# Patient Record
Sex: Female | Born: 1990 | Race: Black or African American | Hispanic: No | Marital: Single | Smoking: Current every day smoker
Health system: Southern US, Community
[De-identification: ages and names within clinical notes are randomized; demographics above are authoritative.]

## PROBLEM LIST (undated history)

## (undated) ENCOUNTER — Inpatient Hospital Stay (HOSPITAL_COMMUNITY): Payer: Self-pay

## (undated) HISTORY — PX: TONSILLECTOMY AND ADENOIDECTOMY: SUR1326

---

## 2012-03-10 ENCOUNTER — Encounter (HOSPITAL_COMMUNITY): Payer: Self-pay | Admitting: *Deleted

## 2012-03-10 ENCOUNTER — Emergency Department (HOSPITAL_COMMUNITY)
Admission: EM | Admit: 2012-03-10 | Discharge: 2012-03-10 | Disposition: A | Discharge status: Home / Self Care | Payer: Self-pay | Attending: Emergency Medicine | Admitting: Emergency Medicine

## 2012-03-10 DIAGNOSIS — E119 Type 2 diabetes mellitus without complications: Secondary | ICD-10-CM | POA: Insufficient documentation

## 2012-03-10 DIAGNOSIS — S53499A Other sprain of unspecified elbow, initial encounter: Secondary | ICD-10-CM | POA: Insufficient documentation

## 2012-03-10 DIAGNOSIS — S56919A Strain of unspecified muscles, fascia and tendons at forearm level, unspecified arm, initial encounter: Secondary | ICD-10-CM

## 2012-03-10 DIAGNOSIS — F17200 Nicotine dependence, unspecified, uncomplicated: Secondary | ICD-10-CM | POA: Insufficient documentation

## 2012-03-10 DIAGNOSIS — X58XXXA Exposure to other specified factors, initial encounter: Secondary | ICD-10-CM | POA: Insufficient documentation

## 2012-03-10 LAB — GLUCOSE, CAPILLARY: Glucose-Capillary: 95 mg/dL (ref 70–99)

## 2012-03-10 NOTE — Progress Notes (Signed)
Orthopedic Tech Progress Note Patient Details:  Brandy Wagner 1990/10/27 [id number]  Ortho Devices Type of Ortho Device: Arm foam sling Ortho Device/Splint Location: (R) UE Ortho Device/Splint Interventions: Application   Jennye Moccasin 03/10/2012, 9:33 PM

## 2012-03-10 NOTE — ED Provider Notes (Signed)
History     CSN: [id number]  Arrival date & time 03/10/12  1845   First MD Initiated Contact with Patient 03/10/12 2001      Chief Complaint  Patient presents with  . Arm Pain    (Consider location/radiation/quality/duration/timing/severity/associated sxs/prior treatment) HPI History provided by pt.   Pt presents w/ non-traumatic right forearm pain w/ associated edema x 1.5 weeks.  Developed associated paresthesias today which prompted her to come to ED.   Pain aggravated by palpation and ROM of elbow, wrist and fingers.  Denies fever and skin changes.  Has never had these sx in the past.    Past Medical History  Diagnosis Date  . Diabetes mellitus     History reviewed. No pertinent past surgical history.  No family history on file.  History  Substance Use Topics  . Smoking status: Current Every Day Smoker  . Smokeless tobacco: Not on file  . Alcohol Use: No    OB History    Grav Para Term Preterm Abortions TAB SAB Ect Mult Living                  Review of Systems  All other systems reviewed and are negative.    Allergies  Review of patient's allergies indicates no known allergies.  Home Medications   Current Outpatient Rx  Name Route Sig Dispense Refill  . METFORMIN HCL 500 MG PO TABS Oral Take 500 mg by mouth daily with breakfast.      BP 135/81  Pulse 89  Temp 98.6 F (37 C) (Oral)  Resp 16  SpO2 100%  LMP 02/09/2012  Physical Exam  Nursing note and vitals reviewed. Constitutional: She is oriented to person, place, and time. She appears well-developed and well-nourished. No distress.  HENT:  Head: Normocephalic and atraumatic.  Eyes:       Normal appearance  Neck: Normal range of motion.  Pulmonary/Chest: Effort normal.  Musculoskeletal:       Right distal forearm with possible mild edema when compared to the left.  No edema of hand and no skin changes.  Diffuse tenderness of entire flexor surface of forearm.  Pain and guarding w/ passive  ROM of all fingers, wrist and elbow.  2+ radial pulse and distal sensation intact.   Neurological: She is alert and oriented to person, place, and time.  Psychiatric: She has a normal mood and affect. Her behavior is normal.    ED Course  Procedures (including critical care time)  Labs Reviewed - No data to display No results found.   1. Muscle strain of forearm       MDM  Healthy 21yo F presents w/ non-traumatic right arm pain.  Exam most consistent w/ muscle strain or tendonitis.  Doubt DVT because low risk and no significant edema, and no signs of infection.  Ortho tech placed in shoulder sling for comfort.  Recommended rest, ice, elevation and NSAID.  Referred to [facility].  Return precautions discussed.         Otilio Miu, [place] 03/10/12 2119

## 2012-03-10 NOTE — ED Notes (Signed)
Ortho Tech paged.

## 2012-03-10 NOTE — ED Notes (Signed)
Painful rt arm from the elbow down to the rt hand .  No known injury.  Her hand is sl swollen.  Good radial pulse

## 2012-03-11 NOTE — ED Provider Notes (Signed)
Medical screening examination/treatment/procedure(s) were performed by non-physician practitioner and as supervising physician I was immediately available for consultation/collaboration.   Carleene Cooper III, MD 03/11/12 1315

## 2012-11-06 ENCOUNTER — Encounter (HOSPITAL_COMMUNITY): Payer: Self-pay | Admitting: Emergency Medicine

## 2012-11-06 ENCOUNTER — Emergency Department (HOSPITAL_COMMUNITY)
Admission: EM | Admit: 2012-11-06 | Discharge: 2012-11-06 | Disposition: A | Discharge status: Home / Self Care | Payer: Self-pay | Attending: Emergency Medicine | Admitting: Emergency Medicine

## 2012-11-06 ENCOUNTER — Emergency Department (HOSPITAL_COMMUNITY): Payer: Self-pay

## 2012-11-06 DIAGNOSIS — N949 Unspecified condition associated with female genital organs and menstrual cycle: Secondary | ICD-10-CM | POA: Insufficient documentation

## 2012-11-06 DIAGNOSIS — Z3202 Encounter for pregnancy test, result negative: Secondary | ICD-10-CM | POA: Insufficient documentation

## 2012-11-06 DIAGNOSIS — E119 Type 2 diabetes mellitus without complications: Secondary | ICD-10-CM | POA: Insufficient documentation

## 2012-11-06 DIAGNOSIS — N83201 Unspecified ovarian cyst, right side: Secondary | ICD-10-CM

## 2012-11-06 DIAGNOSIS — N83209 Unspecified ovarian cyst, unspecified side: Secondary | ICD-10-CM | POA: Insufficient documentation

## 2012-11-06 DIAGNOSIS — F17200 Nicotine dependence, unspecified, uncomplicated: Secondary | ICD-10-CM | POA: Insufficient documentation

## 2012-11-06 DIAGNOSIS — R102 Pelvic and perineal pain: Secondary | ICD-10-CM

## 2012-11-06 LAB — CBC WITH DIFFERENTIAL/PLATELET
Basophils Absolute: 0.1 10*3/uL (ref 0.0–0.1)
Basophils Relative: 1 % (ref 0–1)
Eosinophils Absolute: 0.1 10*3/uL (ref 0.0–0.7)
Eosinophils Relative: 1 % (ref 0–5)
HCT: 35.2 % — ABNORMAL LOW (ref 36.0–46.0)
Hemoglobin: 12.1 g/dL (ref 12.0–15.0)
Lymphocytes Relative: 22 % (ref 12–46)
Lymphs Abs: 1.9 10*3/uL (ref 0.7–4.0)
MCH: 28.5 pg (ref 26.0–34.0)
MCHC: 34.4 g/dL (ref 30.0–36.0)
MCV: 82.8 fL (ref 78.0–100.0)
Monocytes Absolute: 0.8 10*3/uL (ref 0.1–1.0)
Monocytes Relative: 9 % (ref 3–12)
Neutro Abs: 5.6 10*3/uL (ref 1.7–7.7)
Neutrophils Relative %: 67 % (ref 43–77)
Platelets: 423 10*3/uL — ABNORMAL HIGH (ref 150–400)
RBC: 4.25 MIL/uL (ref 3.87–5.11)
RDW: 13.9 % (ref 11.5–15.5)
WBC: 8.4 10*3/uL (ref 4.0–10.5)

## 2012-11-06 LAB — COMPREHENSIVE METABOLIC PANEL
ALT: 13 U/L (ref 0–35)
AST: 20 U/L (ref 0–37)
Albumin: 3.7 g/dL (ref 3.5–5.2)
Alkaline Phosphatase: 105 U/L (ref 39–117)
BUN: 8 mg/dL (ref 6–23)
CO2: 23 mEq/L (ref 19–32)
Calcium: 9.6 mg/dL (ref 8.4–10.5)
Chloride: 106 mEq/L (ref 96–112)
Creatinine, Ser: 0.69 mg/dL (ref 0.50–1.10)
GFR calc Af Amer: >90 mL/min (ref >90)
GFR calc non Af Amer: >90 mL/min (ref >90)
Glucose, Bld: 95 mg/dL (ref 70–99)
Potassium: 4 mEq/L (ref 3.5–5.1)
Sodium: 139 mEq/L (ref 135–145)
Total Bilirubin: 0.6 mg/dL (ref 0.3–1.2)
Total Protein: 7.8 g/dL (ref 6.0–8.3)

## 2012-11-06 LAB — URINALYSIS, ROUTINE W REFLEX MICROSCOPIC
Bilirubin Urine: NEGATIVE
Glucose, UA: NEGATIVE mg/dL
Hgb urine dipstick: NEGATIVE
Ketones, ur: NEGATIVE mg/dL
Leukocytes, UA: NEGATIVE
Nitrite: NEGATIVE
Protein, ur: NEGATIVE mg/dL
Specific Gravity, Urine: 1.009 (ref 1.005–1.030)
Urobilinogen, UA: 0.2 mg/dL (ref 0.0–1.0)
pH: 8 (ref 5.0–8.0)

## 2012-11-06 LAB — POCT PREGNANCY, URINE: Preg Test, Ur: NEGATIVE

## 2012-11-06 MED ORDER — TRAMADOL HCL 50 MG PO TABS: 50.0000 mg | ORAL_TABLET | Freq: Four times a day (QID) | ORAL | Status: DC | PRN

## 2012-11-06 MED ORDER — HYDROMORPHONE HCL PF 1 MG/ML IJ SOLN
1.0000 mg | Freq: Once | INTRAMUSCULAR | Status: AC
  Administered 2012-11-06: 1 mg via INTRAVENOUS
  Filled 2012-11-06: qty 1

## 2012-11-06 NOTE — ED Notes (Addendum)
Pt states after having intercourse she began having severe pain in center of abdomen. Pt has a history of DM and hypertension but does not take any medications.

## 2012-11-06 NOTE — Progress Notes (Signed)
During [facility] ED 11/06/12 visit CM spoke with pt who confirms self pay [facility] resident with no pcp. CM discussed and provided written information for self pay pcps, importance of pcp for f/u care, www.needymeds.org, discounted pharmacies and other [place] resources such as financial assistance, DSS and  health department  Reviewed resources for [employer] self pay pcps like [facility], family medicine at [address], [facility] family practice, general medical clinics, [facility] urgent care plus others, [facility] out patient pharmacies and housing Pt voiced understanding and appreciation of resources provided

## 2012-11-06 NOTE — ED Notes (Signed)
PA in to re-assess pt.

## 2012-11-06 NOTE — ED Notes (Signed)
Pt returned from US

## 2012-11-06 NOTE — ED Provider Notes (Signed)
History     CSN: [id number]  Arrival date & time 11/06/12  1414   First MD Initiated Contact with Patient 11/06/12 1447      Chief Complaint  Patient presents with  . Abdominal Pain    (Consider location/radiation/quality/duration/timing/severity/associated sxs/prior treatment) HPI Comments: Pt presents to the ED for sudden onset of pelvic pain.  States she was having sexual intercourse earlier today with her female partner when she had a sensation of sharp, shooting pain in her pelvic region, worse on the right side.  Denies any vaginal bleeding afterwards.  Pain worse with movement or walking.  Same sexual partner for the past 5 years, no concern for STDs at this time.  No recent dysuria, hematuria, increased urinary frequency, or vaginal discharge.  Denies possibility of pregnancy.  No hx of ovarian cysts, ovarian cysts, or ectopic pregnancy.  The history is provided by the patient.    Past Medical History  Diagnosis Date  . Diabetes mellitus     History reviewed. No pertinent past surgical history.  History reviewed. No pertinent family history.  History  Substance Use Topics  . Smoking status: Current Every Day Smoker  . Smokeless tobacco: Not on file  . Alcohol Use: Yes     Comment: occasionally     OB History   Grav Para Term Preterm Abortions TAB SAB Ect Mult Living                  Review of Systems  Genitourinary: Positive for pelvic pain.  All other systems reviewed and are negative.    Allergies  Review of patient's allergies indicates no known allergies.  Home Medications  No current outpatient prescriptions on file.  BP 123/69  Pulse 79  Temp(Src) 99.4 F (37.4 C) (Oral)  Resp 20  Ht 5' (1.524 m)  Wt 225 lb (102.059 kg)  BMI 43.94 kg/m2  SpO2 100%  LMP 10/02/2012  Physical Exam  Nursing note and vitals reviewed. Constitutional: She is oriented to person, place, and time. She appears well-developed and well-nourished.  HENT:  Head:  Normocephalic and atraumatic.  Eyes: Conjunctivae and EOM are normal.  Neck: Normal range of motion. Neck supple.  Cardiovascular: Normal rate, regular rhythm and normal heart sounds.   Pulmonary/Chest: Effort normal and breath sounds normal.  Abdominal: Soft. Bowel sounds are normal. There is tenderness. There is no guarding, no CVA tenderness, no tenderness at McBurney's point and negative Clopper's sign.    Pelvic TTP, R > L  Musculoskeletal: Normal range of motion.  Neurological: She is alert and oriented to person, place, and time.  Skin: Skin is warm and dry.  Psychiatric: She has a normal mood and affect.    ED Course  Procedures (including critical care time)  Labs Reviewed  CBC WITH DIFFERENTIAL - Abnormal; Notable for the following:    HCT 35.2 (*)    Platelets 423 (*)    All other components within normal limits  URINALYSIS, ROUTINE W REFLEX MICROSCOPIC  COMPREHENSIVE METABOLIC PANEL  POCT PREGNANCY, URINE   US Transvaginal Non-ob  11/06/2012   *RADIOLOGY REPORT*  Clinical Data: Abdominal pain.  Pelvic pain.  Rule out torsion.  TRANSABDOMINAL AND TRANSVAGINAL ULTRASOUND OF PELVIS Technique:  Both transabdominal and transvaginal ultrasound examinations of the pelvis were performed. Transabdominal technique was performed for global imaging of the pelvis including uterus, ovaries, adnexal regions, and pelvic cul-de-sac.  It was necessary to proceed with endovaginal exam following the transabdominal exam to visualize the uterus,  ovaries, and adnexa  .  Comparison:  None  DOPPLER ULTRASOUND OF OVARIES  Technique:  Color and duplex Doppler ultrasound was utilized to evaluate blood flow to the ovaries.  Findings:  Uterus: 7.9 x 3.9 x 4.5 cm. Normal in morphology.  Endometrium: Normal, 1.4 cm.  Right ovary:  5.1 x 3.8 x 3.9 cm.  4.3 x 2.7 x 3.0 cm structure is favored to represent a single multi septated lesion.  Example image 44. No flow within the lesion.  Normal color and spectral  Doppler tracings to the right ovary.  Left ovary: 5.5 x 4.0 x 4.2 cm.  A 3.7 x 3.6 x 2.7 cm lesion within the demonstrates low level internal echoes and enhanced through transmission. No flow within the lesion.  Minimal irregularity in its superior portion on image 67.  Normal color and spectral Doppler tracings to left ovary.  Other findings: Trace free pelvic fluid is likely physiologic.  IMPRESSION:  1.  No evidence of ovarian or adnexal torsion. 2.  Left ovarian lesion which is favored to represent a hemorrhagic cyst or endometrioma.  Per consensus criteria, follow-up ultrasound is 6 weeks is recommended to confirm stability or resolution. 3.  Similarly, a right ovarian lesion is favored to represent a hemorrhagic cyst.  This could be reevaluated on follow-up as well. This recommendation follows the consensus statement:  Management of Asymptomatic Ovarian and Other Adnexal Cysts Imaged at [place]:  Society of Radiologists in Ultrasound Consensus Conference Statement. Radiology 2010; [phone].   Original Report Authenticated By: Jeronimo Greaves, M.D.   US Pelvis Complete  11/06/2012   *RADIOLOGY REPORT*  Clinical Data: Abdominal pain.  Pelvic pain.  Rule out torsion.  TRANSABDOMINAL AND TRANSVAGINAL ULTRASOUND OF PELVIS Technique:  Both transabdominal and transvaginal ultrasound examinations of the pelvis were performed. Transabdominal technique was performed for global imaging of the pelvis including uterus, ovaries, adnexal regions, and pelvic cul-de-sac.  It was necessary to proceed with endovaginal exam following the transabdominal exam to visualize the uterus, ovaries, and adnexa  .  Comparison:  None  DOPPLER ULTRASOUND OF OVARIES  Technique:  Color and duplex Doppler ultrasound was utilized to evaluate blood flow to the ovaries.  Findings:  Uterus: 7.9 x 3.9 x 4.5 cm. Normal in morphology.  Endometrium: Normal, 1.4 cm.  Right ovary:  5.1 x 3.8 x 3.9 cm.  4.3 x 2.7 x 3.0 cm structure is favored to represent a  single multi septated lesion.  Example image 44. No flow within the lesion.  Normal color and spectral Doppler tracings to the right ovary.  Left ovary: 5.5 x 4.0 x 4.2 cm.  A 3.7 x 3.6 x 2.7 cm lesion within the demonstrates low level internal echoes and enhanced through transmission. No flow within the lesion.  Minimal irregularity in its superior portion on image 67.  Normal color and spectral Doppler tracings to left ovary.  Other findings: Trace free pelvic fluid is likely physiologic.  IMPRESSION:  1.  No evidence of ovarian or adnexal torsion. 2.  Left ovarian lesion which is favored to represent a hemorrhagic cyst or endometrioma.  Per consensus criteria, follow-up ultrasound is 6 weeks is recommended to confirm stability or resolution. 3.  Similarly, a right ovarian lesion is favored to represent a hemorrhagic cyst.  This could be reevaluated on follow-up as well. This recommendation follows the consensus statement:  Management of Asymptomatic Ovarian and Other Adnexal Cysts Imaged at [place]:  Society of Radiologists in Ultrasound Consensus Conference Statement. Radiology 2010;  [phone].   Original Report Authenticated By: Jeronimo Greaves, M.D.   [place] Art/ven Flow Abd Pelv Doppler  11/06/2012   *RADIOLOGY REPORT*  Clinical Data: Abdominal pain.  Pelvic pain.  Rule out torsion.  TRANSABDOMINAL AND TRANSVAGINAL ULTRASOUND OF PELVIS Technique:  Both transabdominal and transvaginal ultrasound examinations of the pelvis were performed. Transabdominal technique was performed for global imaging of the pelvis including uterus, ovaries, adnexal regions, and pelvic cul-de-sac.  It was necessary to proceed with endovaginal exam following the transabdominal exam to visualize the uterus, ovaries, and adnexa  .  Comparison:  None  DOPPLER ULTRASOUND OF OVARIES  Technique:  Color and duplex Doppler ultrasound was utilized to evaluate blood flow to the ovaries.  Findings:  Uterus: 7.9 x 3.9 x 4.5 cm. Normal in morphology.   Endometrium: Normal, 1.4 cm.  Right ovary:  5.1 x 3.8 x 3.9 cm.  4.3 x 2.7 x 3.0 cm structure is favored to represent a single multi septated lesion.  Example image 44. No flow within the lesion.  Normal color and spectral Doppler tracings to the right ovary.  Left ovary: 5.5 x 4.0 x 4.2 cm.  A 3.7 x 3.6 x 2.7 cm lesion within the demonstrates low level internal echoes and enhanced through transmission. No flow within the lesion.  Minimal irregularity in its superior portion on image 67.  Normal color and spectral Doppler tracings to left ovary.  Other findings: Trace free pelvic fluid is likely physiologic.  IMPRESSION:  1.  No evidence of ovarian or adnexal torsion. 2.  Left ovarian lesion which is favored to represent a hemorrhagic cyst or endometrioma.  Per consensus criteria, follow-up ultrasound is 6 weeks is recommended to confirm stability or resolution. 3.  Similarly, a right ovarian lesion is favored to represent a hemorrhagic cyst.  This could be reevaluated on follow-up as well. This recommendation follows the consensus statement:  Management of Asymptomatic Ovarian and Other Adnexal Cysts Imaged at [place]:  Society of Radiologists in Ultrasound Consensus Conference Statement. Radiology 2010; [phone].   Original Report Authenticated By: Jeronimo Greaves, M.D.     1. Pelvic pain   2. Bilateral ovarian cysts       MDM   21 y.o. F presenting to the ED for sharp pelvic pain, R > L, following sexual intercourse.  Known sexual partner.  No vaginal bleeding.  Pain well controlled with IV dilaudid.  Labs largely WNL.  U-preg negative.  U/a without signs of infection.  Pelvic u/s with bilateral ovarian cysts, likely hemorrhagic, which will need to be followed.  Pt is not currently established with OB-GYN- she will be referred to [facility] outpatient clinic for repeat u/s in approx 6 weeks.  Rx tramadol for pain.  Discussed findings and plan with pt, she acknowledged understanding and agreed.  Return  precautions advised.       Garlon Hatchet, PA-C 11/06/12 1744  Garlon Hatchet, PA-C 11/06/12 1745

## 2012-11-09 NOTE — ED Provider Notes (Signed)
Medical screening examination/treatment/procedure(s) were performed by non-physician practitioner and as supervising physician I was immediately available for consultation/collaboration.  Khelani Kops, MD 11/09/12 1510 

## 2012-11-19 ENCOUNTER — Encounter (HOSPITAL_COMMUNITY): Payer: Self-pay | Admitting: *Deleted

## 2012-11-19 ENCOUNTER — Emergency Department (HOSPITAL_COMMUNITY)
Admission: EM | Admit: 2012-11-19 | Discharge: 2012-11-19 | Disposition: A | Discharge status: Home / Self Care | Payer: Self-pay | Attending: Emergency Medicine | Admitting: Emergency Medicine

## 2012-11-19 ENCOUNTER — Emergency Department (HOSPITAL_COMMUNITY): Payer: Self-pay

## 2012-11-19 DIAGNOSIS — Z791 Long term (current) use of non-steroidal anti-inflammatories (NSAID): Secondary | ICD-10-CM | POA: Insufficient documentation

## 2012-11-19 DIAGNOSIS — Z3202 Encounter for pregnancy test, result negative: Secondary | ICD-10-CM | POA: Insufficient documentation

## 2012-11-19 DIAGNOSIS — F17200 Nicotine dependence, unspecified, uncomplicated: Secondary | ICD-10-CM | POA: Insufficient documentation

## 2012-11-19 DIAGNOSIS — E119 Type 2 diabetes mellitus without complications: Secondary | ICD-10-CM | POA: Insufficient documentation

## 2012-11-19 DIAGNOSIS — R109 Unspecified abdominal pain: Secondary | ICD-10-CM | POA: Insufficient documentation

## 2012-11-19 LAB — GLUCOSE, CAPILLARY

## 2012-11-19 LAB — POCT PREGNANCY, URINE: Preg Test, Ur: NEGATIVE

## 2012-11-19 MED ORDER — OXYCODONE-ACETAMINOPHEN 5-325 MG PO TABS
2.0000 | ORAL_TABLET | Freq: Once | ORAL | Status: AC
  Administered 2012-11-19: 2 via ORAL
  Filled 2012-11-19: qty 2

## 2012-11-19 MED ORDER — FENTANYL CITRATE 0.05 MG/ML IJ SOLN
50.0000 ug | Freq: Once | INTRAMUSCULAR | Status: AC
  Administered 2012-11-19: 100 ug via INTRAVENOUS
  Filled 2012-11-19: qty 2

## 2012-11-19 MED ORDER — OXYCODONE-ACETAMINOPHEN 5-325 MG PO TABS: 2.0000 | ORAL_TABLET | ORAL | Status: DC | PRN

## 2012-11-19 MED ORDER — ONDANSETRON HCL 4 MG/2ML IJ SOLN
4.0000 mg | Freq: Once | INTRAMUSCULAR | Status: AC
  Administered 2012-11-19: 4 mg via INTRAVENOUS
  Filled 2012-11-19: qty 2

## 2012-11-19 NOTE — ED Notes (Signed)
Pt c/o severe upper abd pain x 30 mins; history of ovarian cyst

## 2012-11-19 NOTE — ED Provider Notes (Signed)
History     CSN: [id number]  Arrival date & time 11/19/12  0002   None     Chief Complaint  Patient presents with  . Abdominal Pain    (Consider location/radiation/quality/duration/timing/severity/associated sxs/prior treatment) HPI  Brandy Wagner is a 22 y.o. female history ovarian cyst who presents to ED with severe pain in RLQ and LLQ.  Patient reports intermittent sudden sharp, stabbing pain in lower quadrants that lasted for 1-2 hours before waning in severity. During the episode she describes her pain at a 10/10 and radiating to upper quadrants. She was last tested for STDs four weeks ago and was found negative. Her last sexual encounter was 6 days ago and she states she uses condoms. She states she feels pressure when urinating, but denies vaginal discharge, itching, vaginal bleeding, fever, chills/sweats, and vomiting.     Past Medical History  Diagnosis Date  . Diabetes mellitus     History reviewed. No pertinent past surgical history.  No family history on file.  History  Substance Use Topics  . Smoking status: Current Every Day Smoker  . Smokeless tobacco: Not on file  . Alcohol Use: Yes     Comment: occasionally     OB History   Grav Para Term Preterm Abortions TAB SAB Ect Mult Living                  Review of Systems  Constitutional: Negative for fever.  Respiratory: Negative for shortness of breath.   Cardiovascular: Negative for chest pain.  Gastrointestinal: Positive for abdominal pain. Negative for nausea, vomiting and diarrhea.  All other systems reviewed and are negative.    Allergies  Review of patient's allergies indicates no known allergies.  Home Medications   Current Outpatient Rx  Name  Route  Sig  Dispense  Refill  . naproxen (NAPROSYN) 500 MG tablet   Oral   Take 500 mg by mouth 2 (two) times daily with a meal.           BP 132/77  Pulse 88  Temp(Src) 97.5 F (36.4 C) (Oral)  Resp 24  Ht 5' (1.524 m)  Wt 220 lb  (99.791 kg)  BMI 42.97 kg/m2  SpO2 100%  LMP 11/14/2012  Physical Exam  Nursing note and vitals reviewed. Constitutional: She is oriented to person, place, and time. She appears well-developed and well-nourished. No distress.  HENT:  Head: Normocephalic.  Mouth/Throat: Oropharynx is clear and moist.  Eyes: Conjunctivae and EOM are normal. Pupils are equal, round, and reactive to light.  Cardiovascular: Normal rate, regular rhythm and intact distal pulses.   Pulmonary/Chest: Effort normal and breath sounds normal. No stridor. No respiratory distress. She has no wheezes. She has no rales. She exhibits no tenderness.  Abdominal: Soft. Bowel sounds are normal. She exhibits no distension and no mass. There is tenderness. There is no rebound and no guarding.  Mild tenderness to palpation of the right upper and right lower quadrant no guarding or rebound the  Musculoskeletal: Normal range of motion.  Neurological: She is alert and oriented to person, place, and time.  Psychiatric: She has a normal mood and affect.    ED Course  Procedures (including critical care time)  Labs Reviewed  GLUCOSE, CAPILLARY - Abnormal; Notable for the following:    Glucose-Capillary 106 (*)    All other components within normal limits   US Transvaginal Non-ob  11/19/2012   *RADIOLOGY REPORT*  Clinical Data:  Pelvic pain.  TRANSABDOMINAL AND TRANSVAGINAL  ULTRASOUND OF PELVIS DOPPLER ULTRASOUND OF OVARIES  Technique:  Both transabdominal and transvaginal ultrasound examinations of the pelvis were performed. Transabdominal technique was performed for global imaging of the pelvis including uterus, ovaries, adnexal regions, and pelvic cul-de-sac.  It was necessary to proceed with endovaginal exam following the transabdominal exam to visualize the uterus and ovaries.  Color and duplex Doppler ultrasound was utilized to evaluate blood flow to the ovaries.  Comparison:  11/06/2012  FINDINGS  Uterus:  The uterus is  anteverted and measures 7.6 x 3.8 x 5 cm. No myometrial mass lesions.  Endometrium:  Normal endometrial stripe thickness measured at 3 mm.  Right ovary: Right ovary measures 4 x 2.5 x 2.1 cm.  Normal follicular changes are demonstrated.  No abnormal adnexal masses. Flow is demonstrated in the right ovary on color flow Doppler imaging.  Left ovary: Left ovary measures 2.7 x 1.9 x 2 cm.  Normal follicular changes are demonstrated.  No abnormal adnexal masses. Flow is demonstrated in the left ovary on color flow Doppler imaging. Left ovarian hemorrhagic cyst seen previously has resolved in the interval.  Small amount of free fluid in the pelvis.  Pulsed Doppler evaluation demonstrates normal low-resistance arterial and venous waveforms in both ovaries.  IMPRESSION:  Normal exam.  No evidence of pelvic mass or other significant abnormality.  No sonographic evidence for ovarian torsion.Small amount of free fluid in the pelvis is likely physiologic.   Original Report Authenticated By: Burman Nieves, M.D.   US Pelvis Complete  11/19/2012   *RADIOLOGY REPORT*  Clinical Data:  Pelvic pain.  TRANSABDOMINAL AND TRANSVAGINAL ULTRASOUND OF PELVIS DOPPLER ULTRASOUND OF OVARIES  Technique:  Both transabdominal and transvaginal ultrasound examinations of the pelvis were performed. Transabdominal technique was performed for global imaging of the pelvis including uterus, ovaries, adnexal regions, and pelvic cul-de-sac.  It was necessary to proceed with endovaginal exam following the transabdominal exam to visualize the uterus and ovaries.  Color and duplex Doppler ultrasound was utilized to evaluate blood flow to the ovaries.  Comparison:  11/06/2012  FINDINGS  Uterus:  The uterus is anteverted and measures 7.6 x 3.8 x 5 cm. No myometrial mass lesions.  Endometrium:  Normal endometrial stripe thickness measured at 3 mm.  Right ovary: Right ovary measures 4 x 2.5 x 2.1 cm.  Normal follicular changes are demonstrated.  No abnormal  adnexal masses. Flow is demonstrated in the right ovary on color flow Doppler imaging.  Left ovary: Left ovary measures 2.7 x 1.9 x 2 cm.  Normal follicular changes are demonstrated.  No abnormal adnexal masses. Flow is demonstrated in the left ovary on color flow Doppler imaging. Left ovarian hemorrhagic cyst seen previously has resolved in the interval.  Small amount of free fluid in the pelvis.  Pulsed Doppler evaluation demonstrates normal low-resistance arterial and venous waveforms in both ovaries.  IMPRESSION:  Normal exam.  No evidence of pelvic mass or other significant abnormality.  No sonographic evidence for ovarian torsion.Small amount of free fluid in the pelvis is likely physiologic.   Original Report Authenticated By: Burman Nieves, M.D.   [place] Art/ven Flow Abd Pelv Doppler  11/19/2012   *RADIOLOGY REPORT*  Clinical Data:  Pelvic pain.  TRANSABDOMINAL AND TRANSVAGINAL ULTRASOUND OF PELVIS DOPPLER ULTRASOUND OF OVARIES  Technique:  Both transabdominal and transvaginal ultrasound examinations of the pelvis were performed. Transabdominal technique was performed for global imaging of the pelvis including uterus, ovaries, adnexal regions, and pelvic cul-de-sac.  It was necessary to proceed  with endovaginal exam following the transabdominal exam to visualize the uterus and ovaries.  Color and duplex Doppler ultrasound was utilized to evaluate blood flow to the ovaries.  Comparison:  11/06/2012  FINDINGS  Uterus:  The uterus is anteverted and measures 7.6 x 3.8 x 5 cm. No myometrial mass lesions.  Endometrium:  Normal endometrial stripe thickness measured at 3 mm.  Right ovary: Right ovary measures 4 x 2.5 x 2.1 cm.  Normal follicular changes are demonstrated.  No abnormal adnexal masses. Flow is demonstrated in the right ovary on color flow Doppler imaging.  Left ovary: Left ovary measures 2.7 x 1.9 x 2 cm.  Normal follicular changes are demonstrated.  No abnormal adnexal masses. Flow is demonstrated in  the left ovary on color flow Doppler imaging. Left ovarian hemorrhagic cyst seen previously has resolved in the interval.  Small amount of free fluid in the pelvis.  Pulsed Doppler evaluation demonstrates normal low-resistance arterial and venous waveforms in both ovaries.  IMPRESSION:  Normal exam.  No evidence of pelvic mass or other significant abnormality.  No sonographic evidence for ovarian torsion.Small amount of free fluid in the pelvis is likely physiologic.   Original Report Authenticated By: Burman Nieves, M.D.     1. Abdominal pain       MDM   Filed Vitals:   11/19/12 0004 11/19/12 0327  BP: 132/77 109/62  Pulse: 88 58  Temp: 97.5 F (36.4 C) 97.8 F (36.6 C)  TempSrc: Oral Oral  Resp: 24 18  Height: 5' (1.524 m)   Weight: 220 lb (99.791 kg)   SpO2: 100% 100%     Pearley Agresti is a 22 y.o. female  Intermittent RLQ pain concern for intermittent ovarian torsion. Color flow Doppler shows no abnormalities. Patient's pain is well-controlled.  Medications  fentaNYL (SUBLIMAZE) injection 50 mcg (100 mcg Intravenous Given 11/19/12 0119)  ondansetron (ZOFRAN) injection 4 mg (4 mg Intravenous Given 11/19/12 0120)  oxyCODONE-acetaminophen (PERCOCET/ROXICET) 5-325 MG per tablet 2 tablet (2 tablets Oral Given 11/19/12 0330)    The patient is hemodynamically stable, appropriate for, and amenable to, discharge at this time. Pt verbalized understanding and agrees with care plan. Outpatient follow-up and return precautions given.    Discharge Medication List as of 11/19/2012  4:10 AM    START taking these medications   Details  oxyCODONE-acetaminophen (PERCOCET/ROXICET) 5-325 MG per tablet Take 2 tablets by mouth every 4 (four) hours as needed for pain., Starting 11/19/2012, Until Discontinued, [employer], PA-C 11/20/12 [phone]

## 2012-11-20 ENCOUNTER — Ambulatory Visit (INDEPENDENT_AMBULATORY_CARE_PROVIDER_SITE_OTHER): Payer: Self-pay | Admitting: Obstetrics & Gynecology

## 2012-11-20 ENCOUNTER — Encounter: Payer: Self-pay | Admitting: Obstetrics & Gynecology

## 2012-11-20 VITALS — BP 137/94 | HR 69 | Ht 60.0 in | Wt 237.2 lb

## 2012-11-20 DIAGNOSIS — R109 Unspecified abdominal pain: Secondary | ICD-10-CM | POA: Insufficient documentation

## 2012-11-20 DIAGNOSIS — N949 Unspecified condition associated with female genital organs and menstrual cycle: Secondary | ICD-10-CM

## 2012-11-20 DIAGNOSIS — R102 Pelvic and perineal pain: Secondary | ICD-10-CM

## 2012-11-20 NOTE — Progress Notes (Signed)
Brandy Wagner is a 22 y.o. female who presents to [facility] clinic today for ED f/u for abdominal pain  Abdominal pain: Seen in ED 2 wks ago and again 1 night ago. No previous episodes of Abd pain prior to these events. LMP was on 11/12/12. Previously regular periods every 28 days. Periods typically not painful but her last one was painful. Denies fever, CP, SOB, constipation, diarrhea. No birth control. Denies any recnet trauma, change in routine, or strenuous exercise/activity.  Recent STD screening on [place] was all negative, but done prior to onset of abd pain. Reports being sexually active w/ condoms for protection  The following portions of the patient's history were reviewed and updated as appropriate: allergies, current medications, past medical history, family and social history, and problem list.  Patient is a 5-6cig smoker per day.   Past Medical History  Diagnosis Date  . Diabetes mellitus     ROS as above otherwise neg.    Medications reviewed. Current Outpatient Prescriptions  Medication Sig Dispense Refill  . naproxen (NAPROSYN) 500 MG tablet Take 500 mg by mouth 2 (two) times daily with a meal.      . oxyCODONE-acetaminophen (PERCOCET/ROXICET) 5-325 MG per tablet Take 2 tablets by mouth every 4 (four) hours as needed for pain.  6 tablet  0   No current facility-administered medications for this visit.    Exam:  BP 137/94  Pulse 69  Ht 5' (1.524 m)  Wt 107.593 kg (237 lb 3.2 oz)  BMI 46.32 kg/m2  LMP 11/14/2012 Gen: Well NAD HEENT: EOMI,  MMM Lungs: CTABL Nl WOB Heart: RRR no MRG Abd: NABS, very mild tenderness to palpation Exts: Non edematous BL  LE, warm and well perfused.  GU: vagina well rugated and w/o DC, cervix nml in appearance, no cervical motion tenderness and no adnexal mass or pain on palpation.   Results for orders placed during the hospital encounter of 11/19/12 (from the past 72 hour(s))  GLUCOSE, CAPILLARY     Status: Abnormal    Collection Time    11/19/12 12:13 AM      Result Value Range   Glucose-Capillary 106 (*) 70 - 99 mg/dL   Comment 1 Documented in Chart     Comment 2 Notify RN    POCT PREGNANCY, URINE     Status: None   Collection Time    11/19/12  2:13 AM      Result Value Range   Preg Test, Ur NEGATIVE  NEGATIVE   Comment:            THE SENSITIVITY OF THIS     METHODOLOGY IS >24 mIU/mL     Attestation of Attending Supervision of Resident: Evaluation and management procedures were performed by the [facility] Medicine Resident under my supervision.  I have seen and examined the patient, reviewed the resident's note and chart, and I agree with the management and plan.  Anibal Henderson, M.D. 11/23/2012 10:51 AM

## 2012-11-20 NOTE — Assessment & Plan Note (Signed)
Likely secondary to ruptures ovarian cyst. Will check GC/Chl, and wet prep for possible STD/vag infection. Pt to f/u w/ PCP as needed Tylenol and NSAIds for relief.

## 2012-11-20 NOTE — Patient Instructions (Addendum)
Thank you for coming in today Your pain is likely from a ruptured ovarian cyst, which may happen from time to time in the future Please take tylenol and ibuprofen for this pain Please establish yourself with a primary care provider here in town   Pelvic Pain, Female Female pelvic pain can be caused by many different things and start from a variety of places. Pelvic pain refers to pain that is located in the lower half of the abdomen and between your hips. The pain may occur over a short period of time (acute) or may be reoccurring (chronic). The cause of pelvic pain may be related to disorders affecting the female reproductive organs (gynecologic), but it may also be related to the bladder, kidney stones, an intestinal complication, or muscle or skeletal problems. Getting help right away for pelvic pain is important, especially if there has been severe, sharp, or a sudden onset of unusual pain. It is also important to get help right away because some types of pelvic pain can be life threatening.  CAUSES  Below are only some of the causes of pelvic pain. The causes of pelvic pain can be in one of several categories.   Gynecologic.  Pelvic inflammatory disease.  Sexually transmitted infection.  Ovarian cyst or a twisted ovarian ligament (ovarian torsion).  Uterine lining that grows outside the uterus (endometriosis).  Fibroids, cysts, or tumors.  Ovulation.  Pregnancy.  Pregnancy that occurs outside the uterus (ectopic pregnancy).  Miscarriage.  Labor.  Abruption of the placenta or ruptured uterus.  Infection.  Uterine infection (endometritis).  Bladder infection.  Diverticulitis.  Miscarriage related to a uterine infection (septic abortion).  Bladder.  Inflammation of the bladder (cystitis).  Kidney stone(s).  Gastrointenstinal.  Constipation.  Diverticulitis.  Neurologic.  Trauma.  Feeling pelvic pain because of mental or emotional causes  (psychosomatic).  Cancers of the bowel or pelvis. EVALUATION  Your caregiver will want to take a careful history of your concerns. This includes recent changes in your health, a careful gynecologic history of your periods (menses), and a sexual history. Obtaining your family history and medical history is also important. Your caregiver may suggest a pelvic exam. A pelvic exam will help identify the location and severity of the pain. It also helps in the evaluation of which organ system may be involved. In order to identify the cause of the pelvic pain and be properly treated, your caregiver may order tests. These tests may include:   A pregnancy test.  Pelvic ultrasonography.  An X-ray exam of the abdomen.  A urinalysis or evaluation of vaginal discharge.  Blood tests. HOME CARE INSTRUCTIONS   Only take over-the-counter or prescription medicines for pain, discomfort, or fever as directed by your caregiver.   Rest as directed by your caregiver.   Eat a balanced diet.   Drink enough fluids to make your urine clear or pale yellow, or as directed.   Avoid sexual intercourse if it causes pain.   Apply warm or cold compresses to the lower abdomen depending on which one helps the pain.   Avoid stressful situations.   Keep a journal of your pelvic pain. Write down when it started, where the pain is located, and if there are things that seem to be associated with the pain, such as food or your menstrual cycle.  Follow up with your caregiver as directed.  SEEK MEDICAL CARE IF:  Your medicine does not help your pain.  You have abnormal vaginal discharge. SEEK IMMEDIATE MEDICAL  CARE IF:   You have heavy bleeding from the vagina.   Your pelvic pain increases.   You feel lightheaded or faint.   You have chills.   You have pain with urination or blood in your urine.   You have uncontrolled diarrhea or vomiting.   You have a fever or persistent symptoms for more  than 3 days.  You have a fever and your symptoms suddenly get worse.   You are being physically or sexually abused.  MAKE SURE YOU:  Understand these instructions.  Will watch your condition.  Will get help if you are not doing well or get worse. Document Released: 04/23/2004 Document Revised: 11/26/2011 Document Reviewed: 09/16/2011 [facility] Patient Information 2014 [place], [place].

## 2012-11-21 LAB — WET PREP, GENITAL

## 2012-11-21 LAB — GC/CHLAMYDIA PROBE AMP: CT Probe RNA: NEGATIVE

## 2012-11-21 NOTE — ED Provider Notes (Signed)
Medical screening examination/treatment/procedure(s) were performed by non-physician practitioner and as supervising physician I was immediately available for consultation/collaboration.  Sunnie Nielsen, MD 11/21/12 [phone]

## 2014-01-18 IMAGING — US US ART/VEN ABD/PELV/SCROTUM DOPPLER LTD
1 series · 13 of 25 positions shown · non-contrast
Comparison: None

DOPPLER ULTRASOUND OF OVARIES

CLINICAL DATA: Abdominal pain.  Pelvic pain.  Rule out torsion.

TRANSABDOMINAL AND TRANSVAGINAL ULTRASOUND OF PELVIS
TECHNIQUE: Both transabdominal and transvaginal ultrasound
examinations of the pelvis were performed. Transabdominal technique
was performed for global imaging of the pelvis including uterus,
ovaries, adnexal regions, and pelvic cul-de-sac.
It was necessary to proceed with endovaginal exam following the
transabdominal exam to visualize the uterus, ovaries, and adnexa  .
TECHNIQUE: Color and duplex Doppler ultrasound was utilized to
evaluate blood flow to the ovaries.

[Series 1: us art/ven abd/pelv/scrotum doppler ltd · 0.30mm/px · 13 of 73 slices shown]
[im 1/73]
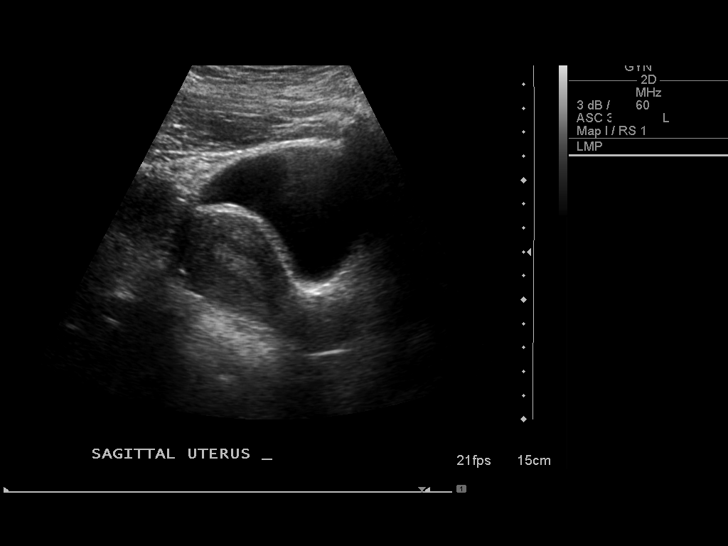
[im 7/73]
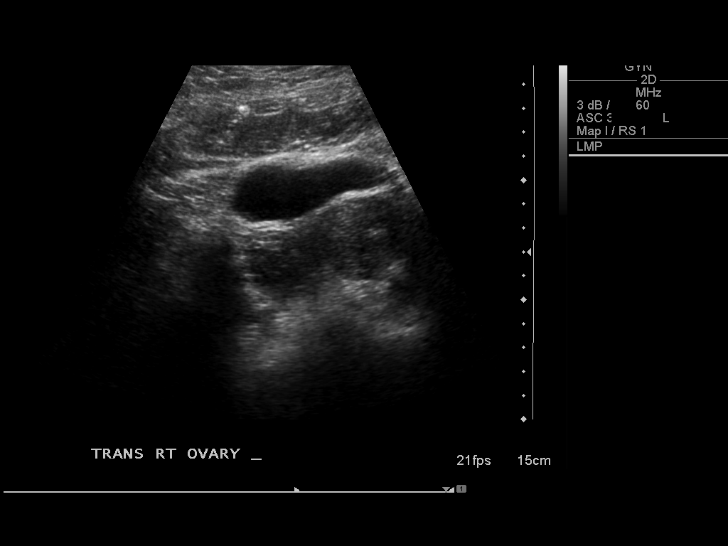
[im 13/73]
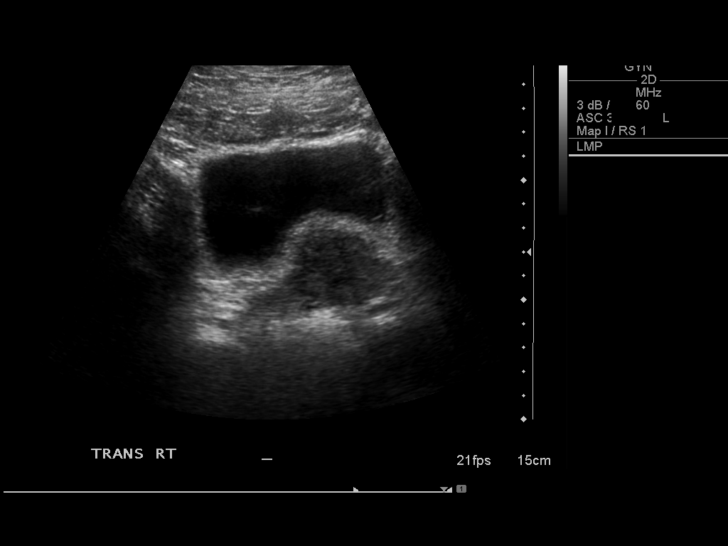
[im 19/73]
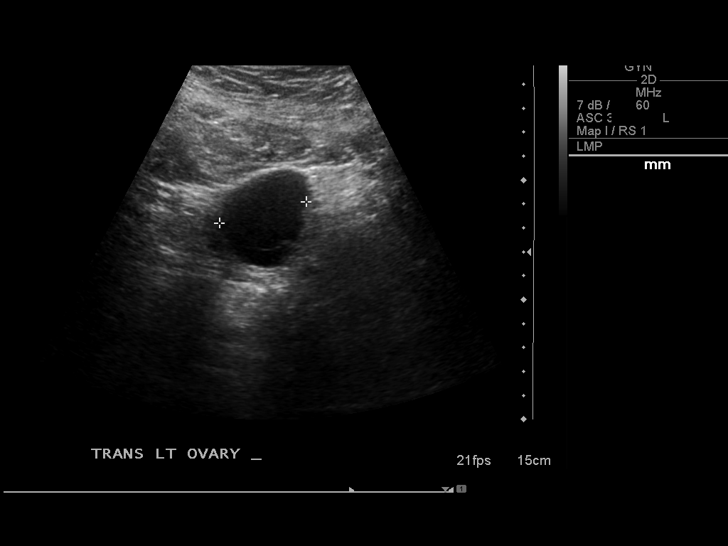
[im 25/73]
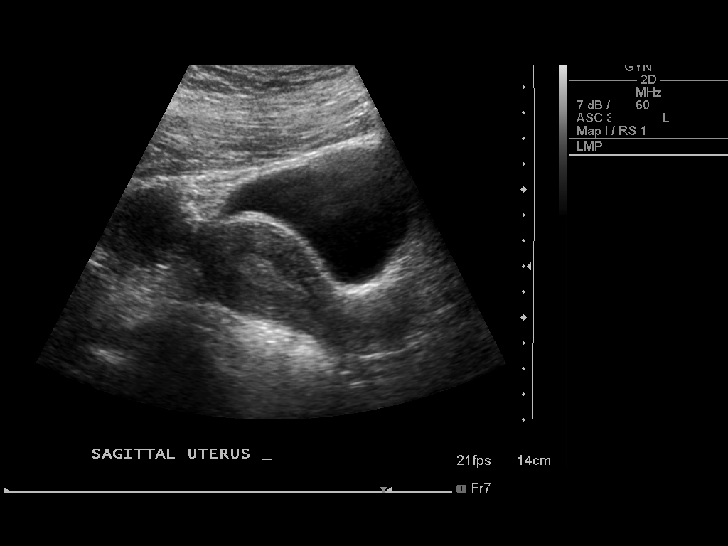
[im 31/73]
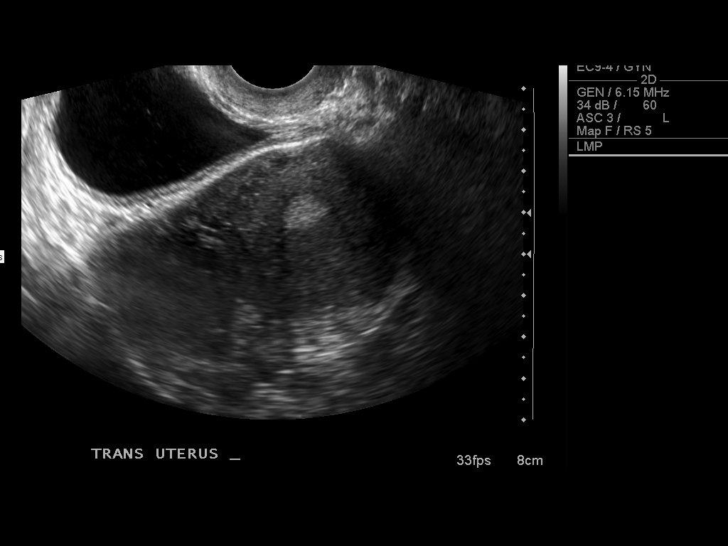
[im 37/73]
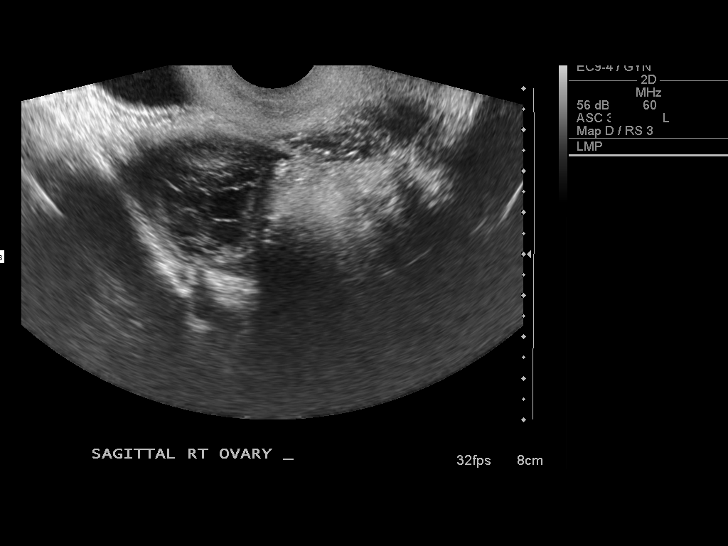
[im 43/73]
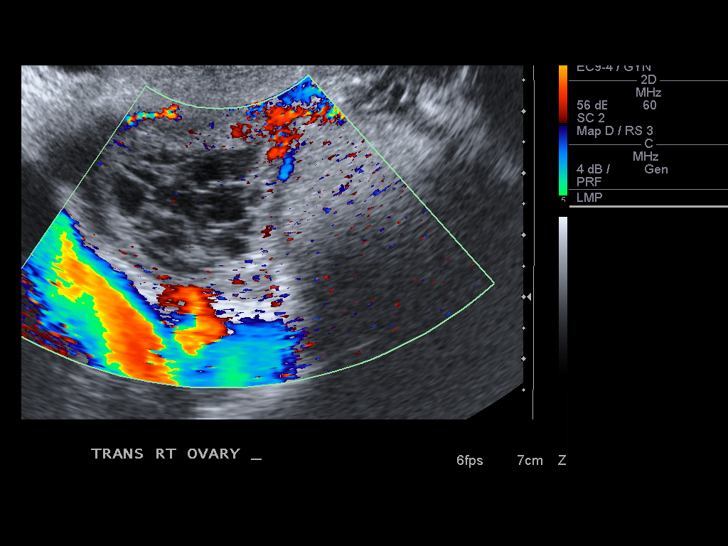
[im 49/73]
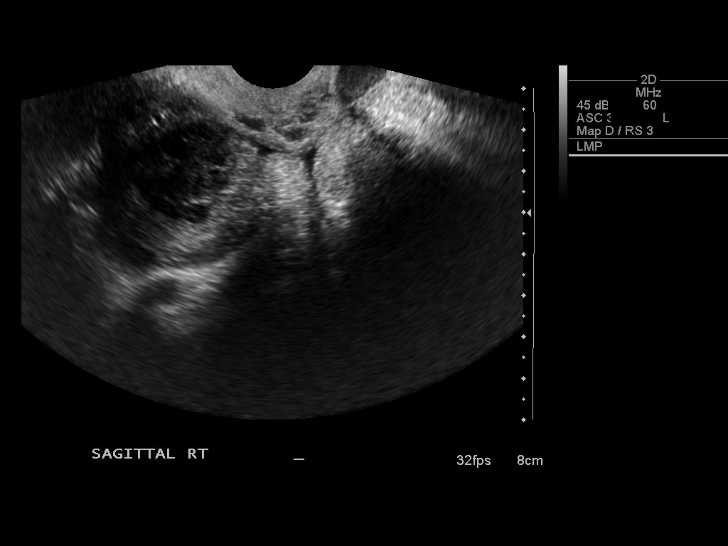
[im 55/73]
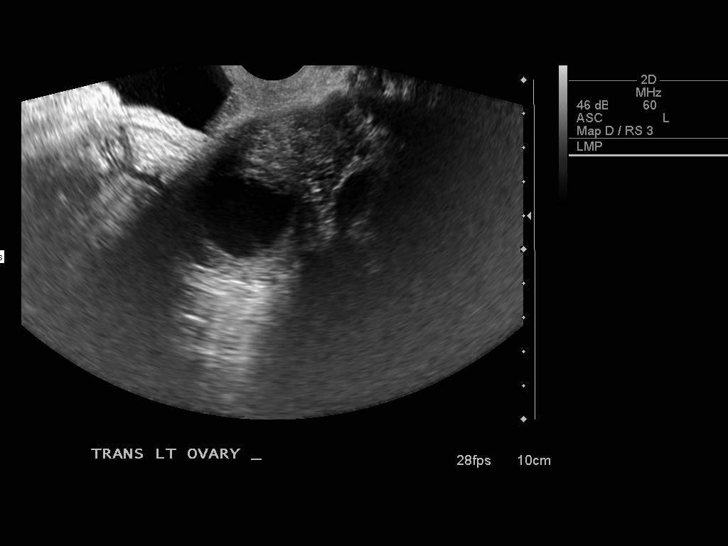
[im 61/73]
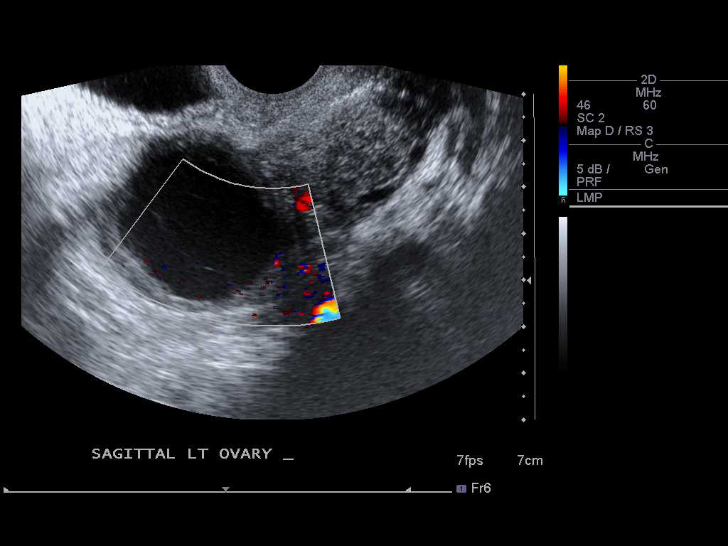
[im 67/73]
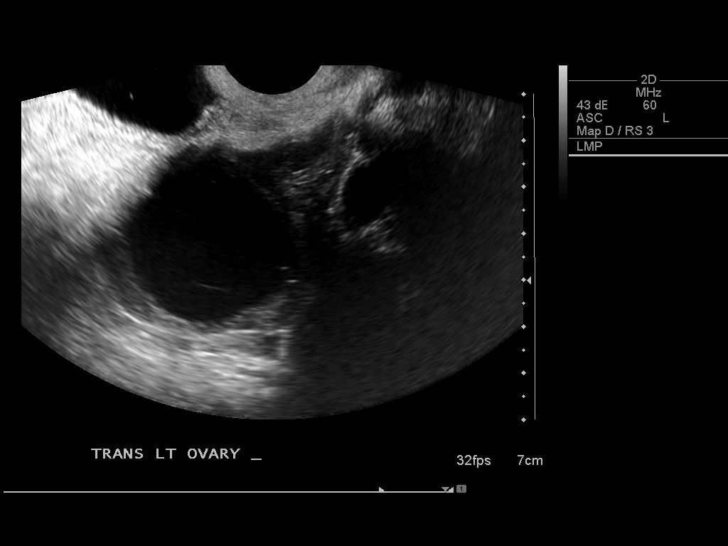
[im 73/73]
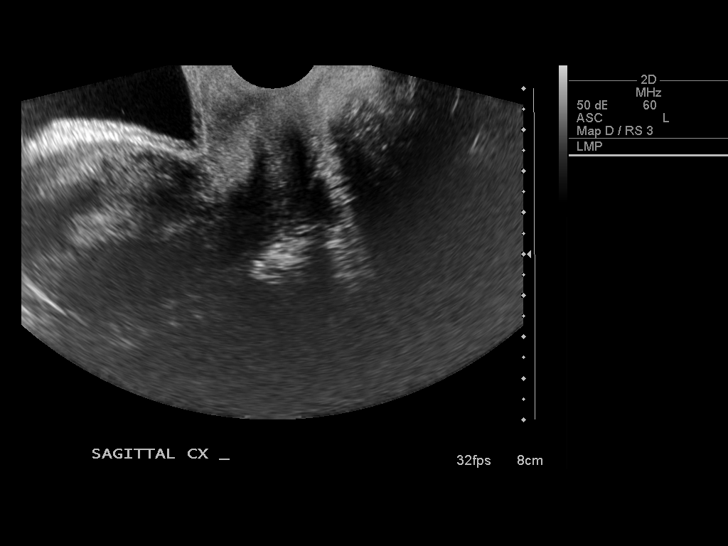

[13 of 25 positions shown; findings below may reference images not displayed]

FINDINGS: Uterus: 7.9 x 3.9 x 4.5 cm. Normal in morphology.

Endometrium: Normal, 1.4 cm.

Right ovary:  5.1 x 3.8 x 3.9 cm.  4.3 x 2.7 x 3.0 cm structure is
favored to represent a single multi septated lesion.  Example image
44. No flow within the lesion.  Normal color and spectral Doppler
tracings to the right ovary.

Left ovary: 5.5 x 4.0 x 4.2 cm.  A 3.7 x 3.6 x 2.7 cm lesion within
the demonstrates low level internal echoes and enhanced through
transmission. No flow within the lesion.  Minimal irregularity in
its superior portion on image 67.

Normal color and spectral Doppler tracings to left ovary.

Other findings: Trace free pelvic fluid is likely physiologic.
IMPRESSION: 1.  No evidence of ovarian or adnexal torsion.
2.  Left ovarian lesion which is favored to represent a hemorrhagic
cyst or endometrioma.  Per consensus criteria, follow-up ultrasound
is 6 weeks is recommended to confirm stability or resolution.
3.  Similarly, a right ovarian lesion is favored to represent a
hemorrhagic cyst.  This could be reevaluated on follow-up as well..
This recommendation follows the consensus statement:  Management of
Asymptomatic Ovarian and Other Adnexal Cysts Imaged at US:  Society
of Radiologists in Ultrasound Consensus Conference Statement.

## 2016-10-20 ENCOUNTER — Encounter (HOSPITAL_COMMUNITY): Payer: Self-pay | Admitting: *Deleted

## 2016-10-20 ENCOUNTER — Inpatient Hospital Stay (HOSPITAL_COMMUNITY)
Admission: AD | Admit: 2016-10-20 | Discharge: 2016-10-20 | Disposition: A | Discharge status: Home / Self Care | Payer: Medicaid - Out of State | Source: Ambulatory Visit | Attending: Obstetrics & Gynecology | Admitting: Obstetrics & Gynecology

## 2016-10-20 DIAGNOSIS — Z79899 Other long term (current) drug therapy: Secondary | ICD-10-CM | POA: Insufficient documentation

## 2016-10-20 DIAGNOSIS — O471 False labor at or after 37 completed weeks of gestation: Secondary | ICD-10-CM | POA: Diagnosis not present

## 2016-10-20 DIAGNOSIS — Z3A37 37 weeks gestation of pregnancy: Secondary | ICD-10-CM | POA: Diagnosis not present

## 2016-10-20 DIAGNOSIS — O99333 Smoking (tobacco) complicating pregnancy, third trimester: Secondary | ICD-10-CM | POA: Insufficient documentation

## 2016-10-20 DIAGNOSIS — O24113 Pre-existing diabetes mellitus, type 2, in pregnancy, third trimester: Secondary | ICD-10-CM | POA: Diagnosis not present

## 2016-10-20 NOTE — Discharge Instructions (Signed)

## 2016-10-20 NOTE — MAU Note (Signed)
Contractions that started this am and have gotten progressively worse. Patient reports 3-5 minutes with increased vaginal pressure. Denies LOF, VB at this time. States has not felt baby move since this morning. [place]; here visiting friends/family. Endorses GBS neg.

## 2016-10-20 NOTE — MAU Note (Addendum)
Having contractions, pain in her back and pelvic pressure.  Started at 0800.  Not as close now.  [place] in [place]. First baby

## 2016-10-20 NOTE — MAU Provider Note (Signed)
  History     CSN: [id number]  Arrival date and time: 10/20/16 1148   First Provider Initiated Contact with Patient 10/20/16 1220      Chief Complaint  Patient presents with  . Contractions   HPI  Ms. Adore Eulah PontMurphy is a 26 yo G1P0 at 37.[redacted] wks gestation by LMP presenting with complaints of decreased FM since 0600 and contractions since 0800.  She is in town viisting with family. She [place] prenatal [place] in [place].  She was last seen by her OB on 5/9 and has a ROB visit tomorrow.  She reports no cervical exam at last appt. She also reports low back pain.  Denies VB or LOF.  Past Medical History:  Diagnosis Date  . Diabetes mellitus     Past Surgical History:  Procedure Laterality Date  . TONSILLECTOMY AND ADENOIDECTOMY     at age 26    History reviewed. No pertinent family history.  Social History  Substance Use Topics  . Smoking status: Current Every Day Smoker  . Smokeless tobacco: Never Used  . Alcohol use Yes     Comment: occasionally     Allergies: No Known Allergies  Prescriptions Prior to Admission  Medication Sig Dispense Refill Last Dose  . naproxen (NAPROSYN) 500 MG tablet Take 500 mg by mouth 2 (two) times daily with a meal.   Taking  . oxyCODONE-acetaminophen (PERCOCET/ROXICET) 5-325 MG per tablet Take 2 tablets by mouth every 4 (four) hours as needed for pain. 6 tablet 0 Taking    Review of Systems  Constitutional: Negative.   HENT: Negative.   Eyes: Negative.   Respiratory: Negative.   Cardiovascular: Negative.   Gastrointestinal: Positive for abdominal pain (contractions since 0800).  Endocrine: Negative.   Musculoskeletal: Negative.   Skin: Negative.   Allergic/Immunologic: Negative.    Physical Exam   Blood pressure 107/60, pulse (!) 102, temperature 98.2 F (36.8 C), temperature source Oral, resp. rate 20, weight 125.4 kg (276 lb 8 oz), SpO2 100 %.  Physical Exam  Constitutional: She is oriented to person, place, and time. She  appears well-developed and well-nourished.  HENT:  Head: Normocephalic.  Eyes: Pupils are equal, round, and reactive to light.  Neck: Normal range of motion. Neck supple.  Cardiovascular: Normal rate, normal heart sounds and intact distal pulses.   Respiratory: Effort normal and breath sounds normal.  GI: Soft. Bowel sounds are normal.  Genitourinary:  Genitourinary Comments: cx: closed/50%/vtx/-3/ballotable/ posterior  Musculoskeletal: Normal range of motion.  Neurological: She is alert and oriented to person, place, and time. She has normal reflexes.  Skin: Skin is warm and dry.  Psychiatric: She has a normal mood and affect. Her behavior is normal. Judgment and thought content normal.    CEFM  FHR: 140 bpm / moderate variability / accels present / decels absent TOCO: irregular contractions  MAU Course  Procedures  MDM CCUA NST VE  Assessment and Plan  26 yo G1P0 37.[redacted] wks gestation False Labor  Discharge Home Instructions on False Labor  [place] [facility] [place] Patient verbalized an understanding of the plan of care and agrees.  Raelyn Moraolitta Keone Kamer MSN, CNM 10/20/2016, 12:23 PM

## 2016-10-25 ENCOUNTER — Encounter (HOSPITAL_COMMUNITY): Payer: Self-pay

## 2016-10-25 ENCOUNTER — Emergency Department (HOSPITAL_COMMUNITY)
Admission: EM | Admit: 2016-10-25 | Discharge: 2016-10-25 | Disposition: A | Discharge status: Home / Self Care | Payer: Medicaid - Out of State | Attending: Emergency Medicine | Admitting: Emergency Medicine

## 2016-10-25 DIAGNOSIS — O24419 Gestational diabetes mellitus in pregnancy, unspecified control: Secondary | ICD-10-CM | POA: Insufficient documentation

## 2016-10-25 DIAGNOSIS — O99333 Smoking (tobacco) complicating pregnancy, third trimester: Secondary | ICD-10-CM | POA: Diagnosis not present

## 2016-10-25 DIAGNOSIS — O4700 False labor before 37 completed weeks of gestation, unspecified trimester: Secondary | ICD-10-CM

## 2016-10-25 DIAGNOSIS — Z3A38 38 weeks gestation of pregnancy: Secondary | ICD-10-CM | POA: Insufficient documentation

## 2016-10-25 DIAGNOSIS — O479 False labor, unspecified: Secondary | ICD-10-CM

## 2016-10-25 DIAGNOSIS — F17200 Nicotine dependence, unspecified, uncomplicated: Secondary | ICD-10-CM | POA: Insufficient documentation

## 2016-10-25 NOTE — Progress Notes (Signed)
[facility] for a G1P0 38.0 weeks who came in complaining of contractions since 6am. [facility] [place]. Placed on fetal monitor. Denies any leaking fluid or bleeding. Pt states baby is moving well. Will assess for labor.

## 2016-10-25 NOTE — ED Triage Notes (Signed)
Pt is [redacted] weeks pregnant and began having contractions this morning after waking up 6 am. Pt now reporting contractions about 3 minutes apart associated with back pain. She denies any leakage of fluids or vaginal bleeding. This is her first pregnancy.

## 2016-10-25 NOTE — ED Notes (Signed)
Pt placed on the fetal heart rate monitor, Babys HR 175

## 2016-10-25 NOTE — Progress Notes (Signed)
Called Dr Jerrol BananaAnwanyu, informed of pt status, reactive FHR tracing, UC pattern, SVE. OB cleared.

## 2016-10-25 NOTE — Discharge Instructions (Signed)
As discussed, your evaluation today has been largely reassuring.  But, it is important that you monitor your condition carefully, and do not hesitate to return to the ED if you develop new, or concerning changes in your condition. ? ?Otherwise, please follow-up with your physician for appropriate ongoing care. ? ?

## 2016-10-25 NOTE — ED Notes (Signed)
Rapid Response RN at bedside.  

## 2016-10-25 NOTE — ED Notes (Signed)
Pt given happy meal. 

## 2016-10-25 NOTE — ED Provider Notes (Signed)
[facility]-EMERGENCY DEPT Provider Note   CSN: [id number] Arrival date & time: 10/25/16  1243     History   Chief Complaint No chief complaint on file.   HPI Brandy Wagner is a 26 y.o. female.  HPI  He presents with concern of contractions. Patient is a G1 P0 at approximately 38 weeks in a generally unremarkable pregnancy aside from hypertension which has been observed. Patient was here with a family member, which ongoing contractions, approximately every 7 minutes apart. She denies other abdominal pain, discomfort, lightheadedness, nausea. She has no vaginal bleeding, no clear fluid discharge, but with persistent contractions she presents for evaluation. Patient is from [place].   Past Medical History:  Diagnosis Date  . Diabetes mellitus     Patient Active Problem List   Diagnosis Date Noted  . Abdominal pain 11/20/2012    Past Surgical History:  Procedure Laterality Date  . TONSILLECTOMY AND ADENOIDECTOMY     at age 23    OB History    Gravida Para Term Preterm AB Living   1 0 0 0 0 0   SAB TAB Ectopic Multiple Live Births   0 0 0 0         Home Medications    Prior to Admission medications   Medication Sig Start Date End Date Taking? Authorizing Provider  labetalol (NORMODYNE) 100 MG tablet Take 100 mg by mouth 2 (two) times daily.    [provider]  Prenatal Vit-Fe Fumarate-FA (PRENATAL MULTIVITAMIN) TABS tablet Take 1 tablet by mouth daily at 12 noon.    [provider]    Family History No family history on file.  Social History Social History  Substance Use Topics  . Smoking status: Current Every Day Smoker  . Smokeless tobacco: Never Used  . Alcohol use Yes     Comment: occasionally      Allergies   Patient has no known allergies.   Review of Systems Review of Systems  Constitutional:       Per HPI, otherwise negative  HENT:       Per HPI, otherwise negative  Respiratory:       Per HPI, otherwise negative    Cardiovascular:       Per HPI, otherwise negative  Gastrointestinal: Negative for vomiting.  Endocrine:       Negative aside from HPI  Genitourinary:       Neg aside from HPI   Musculoskeletal:       Per HPI, otherwise negative  Skin: Negative.   Neurological: Negative for syncope.     Physical Exam Updated Vital Signs There were no vitals taken for this visit.  Physical Exam  Constitutional: She is oriented to person, place, and time. She appears well-developed and well-nourished. No distress.  Gravity and female awake, alert, sitting upright, speaking clearly.   HENT:  Head: Normocephalic and atraumatic.  Eyes: Conjunctivae and EOM are normal.  Cardiovascular: Normal rate and regular rhythm.   Pulmonary/Chest: Effort normal and breath sounds normal. No stridor. No respiratory distress.  Abdominal: She exhibits no distension.    Musculoskeletal: She exhibits no edema.  Neurological: She is alert and oriented to person, place, and time. No cranial nerve deficit.  Skin: Skin is warm and dry.  Psychiatric: She has a normal mood and affect.  Nursing note and vitals reviewed.    ED Treatments / Results    Procedures Procedures (including critical care time)  Immediately after the patient arrived in our resuscitation bay  the tocometry pads were applied, a rapid response obstetric team was paged. Patient tolerated this well, initial fetal heart tones in the 140 range.  Initial Impression / Assessment and Plan / ED Course  I have reviewed the triage vital signs and the nursing notes.  Pertinent labs & imaging results that were available during my care of the patient were reviewed by me and considered in my medical decision making (see chart for details).  Patient is sitting upright, in no distress. She has been evaluated by our rapid response team, who has cleared her for outpatient follow-up with her obstetrician. Still no fluid leakage, no evidence for ongoing  delivery.   Final Clinical Impressions(s) / ED Diagnoses   Final diagnoses:  Preterm contractions     Gerhard MunchLockwood, Nikolaus Pienta, MD 10/25/16 1418

## 2017-04-14 ENCOUNTER — Encounter (HOSPITAL_COMMUNITY): Payer: Self-pay
